# Patient Record
Sex: Male | Born: 2000 | Race: White | Hispanic: No | Marital: Single | Smoking: Never smoker
Health system: Southern US, Community
[De-identification: ages and names within clinical notes are randomized; demographics above are authoritative.]

---

## 2019-09-07 ENCOUNTER — Other Ambulatory Visit: Payer: Self-pay

## 2019-09-07 ENCOUNTER — Emergency Department: Payer: BC Managed Care – PPO

## 2019-09-07 ENCOUNTER — Emergency Department
Admission: EM | Admit: 2019-09-07 | Discharge: 2019-09-07 | Disposition: A | Discharge status: Home / Self Care | Payer: BC Managed Care – PPO | Attending: Emergency Medicine | Admitting: Emergency Medicine

## 2019-09-07 DIAGNOSIS — S61411A Laceration without foreign body of right hand, initial encounter: Secondary | ICD-10-CM | POA: Diagnosis not present

## 2019-09-07 DIAGNOSIS — Y280XXA Contact with sharp glass, undetermined intent, initial encounter: Secondary | ICD-10-CM | POA: Insufficient documentation

## 2019-09-07 DIAGNOSIS — M79641 Pain in right hand: Secondary | ICD-10-CM | POA: Insufficient documentation

## 2019-09-07 DIAGNOSIS — Y929 Unspecified place or not applicable: Secondary | ICD-10-CM | POA: Insufficient documentation

## 2019-09-07 DIAGNOSIS — Y939 Activity, unspecified: Secondary | ICD-10-CM | POA: Insufficient documentation

## 2019-09-07 DIAGNOSIS — Y999 Unspecified external cause status: Secondary | ICD-10-CM | POA: Insufficient documentation

## 2019-09-07 MED ORDER — SULFAMETHOXAZOLE-TRIMETHOPRIM 800-160 MG PO TABS: 1.0000 | ORAL_TABLET | Freq: Two times a day (BID) | ORAL | 0 refills | Status: AC

## 2019-09-07 NOTE — Discharge Instructions (Signed)
Take Tylenol and ibuprofen alternating for right hand pain. Take Bactrim twice daily for the next week.

## 2019-09-07 NOTE — ED Notes (Signed)
Pt has right hand injury and laceration noted to hand. Bleeding controlled at this time

## 2019-09-07 NOTE — ED Triage Notes (Signed)
Right hand injury and laceration after "punching through glass" yesterday. Pt alert and oriented X4, cooperative, RR even and unlabored, color WNL. Pt in NAD.

## 2019-09-07 NOTE — ED Provider Notes (Signed)
[facility] Emergency Department Provider Note  ____________________________________________  Time seen: Approximately 2:58 PM  I have reviewed the triage vital signs and the nursing notes.   HISTORY  Chief Complaint Laceration and Hand Injury    HPI Samuel Stokes is a 18 y.o. male presents to the emergency department with a 1 cm laceration to the dorsal aspect of the right fourth digit.  Patient reports that he was tapping on glass and glass busted at around 12 AM last night.  Patient has experience right hand discomfort since injury occurred.  He was seen and evaluated at a local urgent care who recommended that he come to the emergency department because his laceration was "too deep".  He denies numbness or tingling in the right hand or sensation of coldness.  Tetanus status is up-to-date.  No other alleviating measures have been attempted.        History reviewed. No pertinent past medical history.  There are no active problems to display for this patient.   History reviewed. No pertinent surgical history.  Prior to Admission medications   Medication Sig Start Date End Date Taking? Authorizing Provider  sulfamethoxazole-trimethoprim (BACTRIM DS) 800-160 MG tablet Take 1 tablet by mouth 2 (two) times daily for 7 days. 09/07/19 09/14/19  Orvil Feil, PA-C    Allergies Amoxicillin and Penicillins  No family history on file.  Social History Social History   Tobacco Use  . Smoking status: Never Smoker  Substance Use Topics  . Alcohol use: Yes    Comment: occass  . Drug use: Not on file     Review of Systems  Constitutional: No fever/chills Eyes: No visual changes. No discharge ENT: No upper respiratory complaints. Cardiovascular: no chest pain. Respiratory: no cough. No SOB. Gastrointestinal: No abdominal pain.  No nausea, no vomiting.  No diarrhea.  No constipation. Musculoskeletal: Patient has right hand pain  Skin: Patient has  right hand laceration.  Neurological: Negative for headaches, focal weakness or numbness.  ____________________________________________   PHYSICAL EXAM:  VITAL SIGNS: ED Triage Vitals [09/07/19 1402]  Enc Vitals Group     BP (!) 146/84     Pulse Rate 83     Resp 18     Temp 98.4 F (36.9 C)     Temp Source Oral     SpO2 98 %     Weight 165 lb (74.8 kg)     Height 5\' 10"  (1.778 m)     Head Circumference      Peak Flow      Pain Score 2     Pain Loc      Pain Edu?      Excl. in GC?      Constitutional: Alert and oriented. Well appearing and in no acute distress. Eyes: Conjunctivae are normal. PERRL. EOMI. Head: Atraumatic. Cardiovascular: Normal rate, regular rhythm. Normal S1 and S2.  Good peripheral circulation. Respiratory: Normal respiratory effort without tachypnea or retractions. Lungs CTAB. Good air entry to the bases with no decreased or absent breath sounds. Gastrointestinal: Bowel sounds 4 quadrants. Soft and nontender to palpation. No guarding or rigidity. No palpable masses. No distention. No CVA tenderness. Musculoskeletal: Patient performs full range of motion at the right wrist and right hand.  No flexor or extensor tendon deficits are appreciated with testing.  Palpable radial and ulnar pulses bilaterally and symmetrically. Neurologic:  Normal speech and language. No gross focal neurologic deficits are appreciated.  Skin: Patient has a 1 cm semilunar  laceration along the dorsal aspect of the right fourth digit. Psychiatric: Mood and affect are normal. Speech and behavior are normal. Patient exhibits appropriate insight and judgement.   ____________________________________________   LABS (all labs ordered are listed, but only abnormal results are displayed)  Labs Reviewed - No data to display ____________________________________________  EKG   ____________________________________________  RADIOLOGY   Dg Hand Complete Right  Result Date:  09/07/2019 CLINICAL DATA:  Laceration to second, fourth and fifth digits EXAM: RIGHT HAND - COMPLETE 3+ VIEW COMPARISON:  None. FINDINGS: Radiographically evident laceration seen along the dorsal aspect of the fourth digit at the level of the head of the proximal phalanx. Reported lacerations of the second and fifth digit are not visualized on radiography. Additional geometric lucency projects at the level of the interspace between the second and third metacarpals likely additional laceration. No associated osseous injuries are seen. No traumatic malalignment. IMPRESSION: Radiographically evident laceration along the volar aspect of the hand between the second and third metacarpals and along the volar aspect of the fourth digit near the head of the proximal phalanx. No associated osseous injury. No other radiographically evident laceration. Electronically Signed   By: Lovena Le M.D.   On: 09/07/2019 15:26    ____________________________________________    PROCEDURES  Procedure(s) performed:    Procedures  LACERATION REPAIR Performed by: Lannie Fields Authorized by: Lannie Fields Consent: Verbal consent obtained. Risks and benefits: risks, benefits and alternatives were discussed Consent given by: patient Patient identity confirmed: provided demographic data Prepped and Draped in normal sterile fashion Wound explored  Laceration Location: Right fourth digit  Laceration Length: 1 cm  No Foreign Bodies seen or palpated  Irrigation method: syringe Amount of cleaning: standard  Skin closure: Steri-strips   Patient tolerance: Patient tolerated the procedure well with no immediate complications.   Medications - No data to display   ____________________________________________   INITIAL IMPRESSION / ASSESSMENT AND PLAN / ED COURSE  Pertinent labs & imaging results that were available during my care of the patient were reviewed by me and considered in my medical decision  making (see chart for details).  Review of the [place] CSRS was performed in accordance of the [place] prior to dispensing any controlled drugs.           Assessment and Plan: Right hand pain:  Patient presents to the emergency department with acute right hand pain with a semilunar laceration along the dorsal aspect of the right fourth digit.  X-ray examination of the right hand reveals no bony abnormality.  Laceration was repaired in the emergency department with Steri-Strips due to the amount of time laceration has been open.  Patient was discharged with Bactrim.  Patient was advised to follow-up with primary care as needed.  All patient questions were answered.    ____________________________________________  FINAL CLINICAL IMPRESSION(S) / ED DIAGNOSES  Final diagnoses:  Pain of right hand      NEW MEDICATIONS STARTED DURING THIS VISIT:  ED Discharge Orders         Ordered    sulfamethoxazole-trimethoprim (BACTRIM DS) 800-160 MG tablet  2 times daily     09/07/19 1534              This chart was dictated using voice recognition software/Dragon. Despite best efforts to proofread, errors can occur which can change the meaning. Any change was purely unintentional. Microphone test   Lannie Fields, PA-C 09/07/19 1538    Blake Divine, MD 09/08/19 0001

## 2019-10-26 ENCOUNTER — Ambulatory Visit: Payer: Self-pay | Admitting: *Deleted

## 2019-10-26 NOTE — Telephone Encounter (Signed)
Third attempt to contact patient- same message as previous. Call closed.

## 2019-10-26 NOTE — Telephone Encounter (Signed)
Patient would like a call back regarding having a positive covid test. He is wanting to go out of town and needed to know when should he test again. Would like a call back please   Attempted to call patient- mailbox is full and can not receive messages at this time - unable to leave message to call back

## 2019-10-26 NOTE — Telephone Encounter (Signed)
Attempted to call patient back- same message as previous.

## 2020-09-01 ENCOUNTER — Other Ambulatory Visit: Payer: BC Managed Care – PPO

## 2020-09-01 DIAGNOSIS — Z20822 Contact with and (suspected) exposure to covid-19: Secondary | ICD-10-CM

## 2020-09-02 LAB — SARS-COV-2, NAA 2 DAY TAT

## 2020-09-02 LAB — NOVEL CORONAVIRUS, NAA: SARS-CoV-2, NAA: NOT DETECTED

## 2021-02-05 IMAGING — DX DG HAND COMPLETE 3+V*R*
3 series · 3 of 3 positions shown · non-contrast
Comparison: None.

CLINICAL DATA: Laceration to second, fourth and fifth digits

EXAM:
RIGHT HAND - COMPLETE 3+ VIEW

[hand ap]
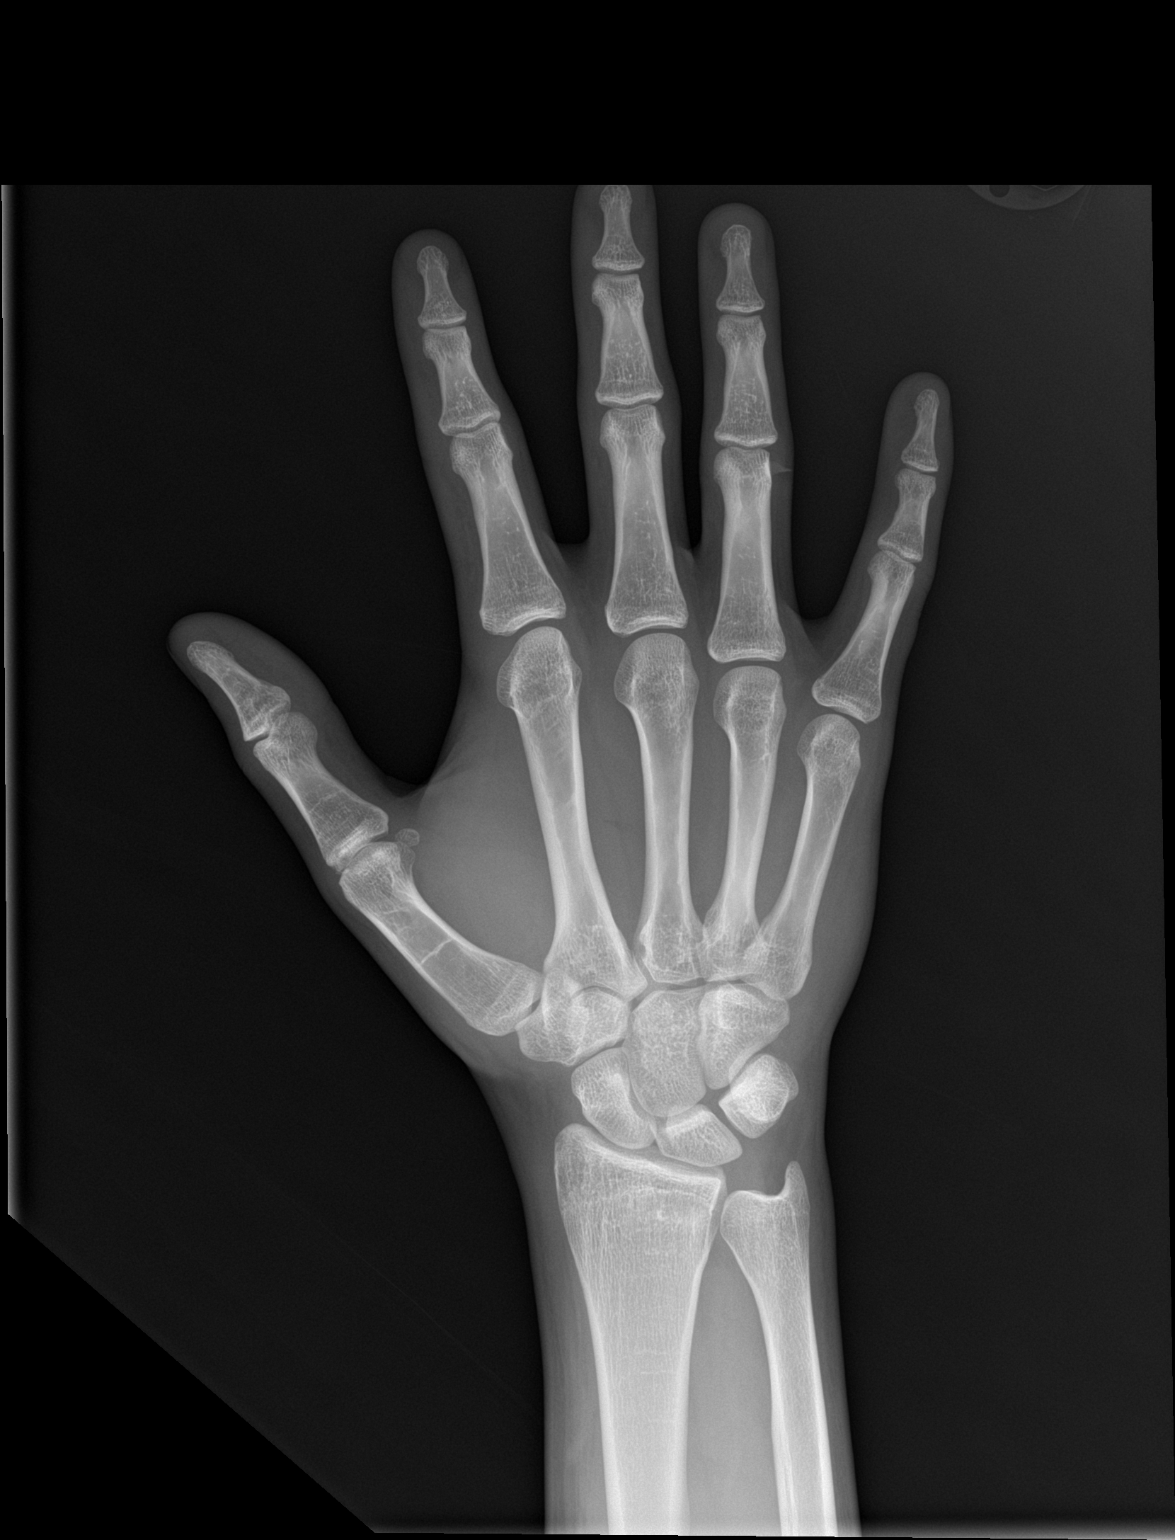

[hand obl]
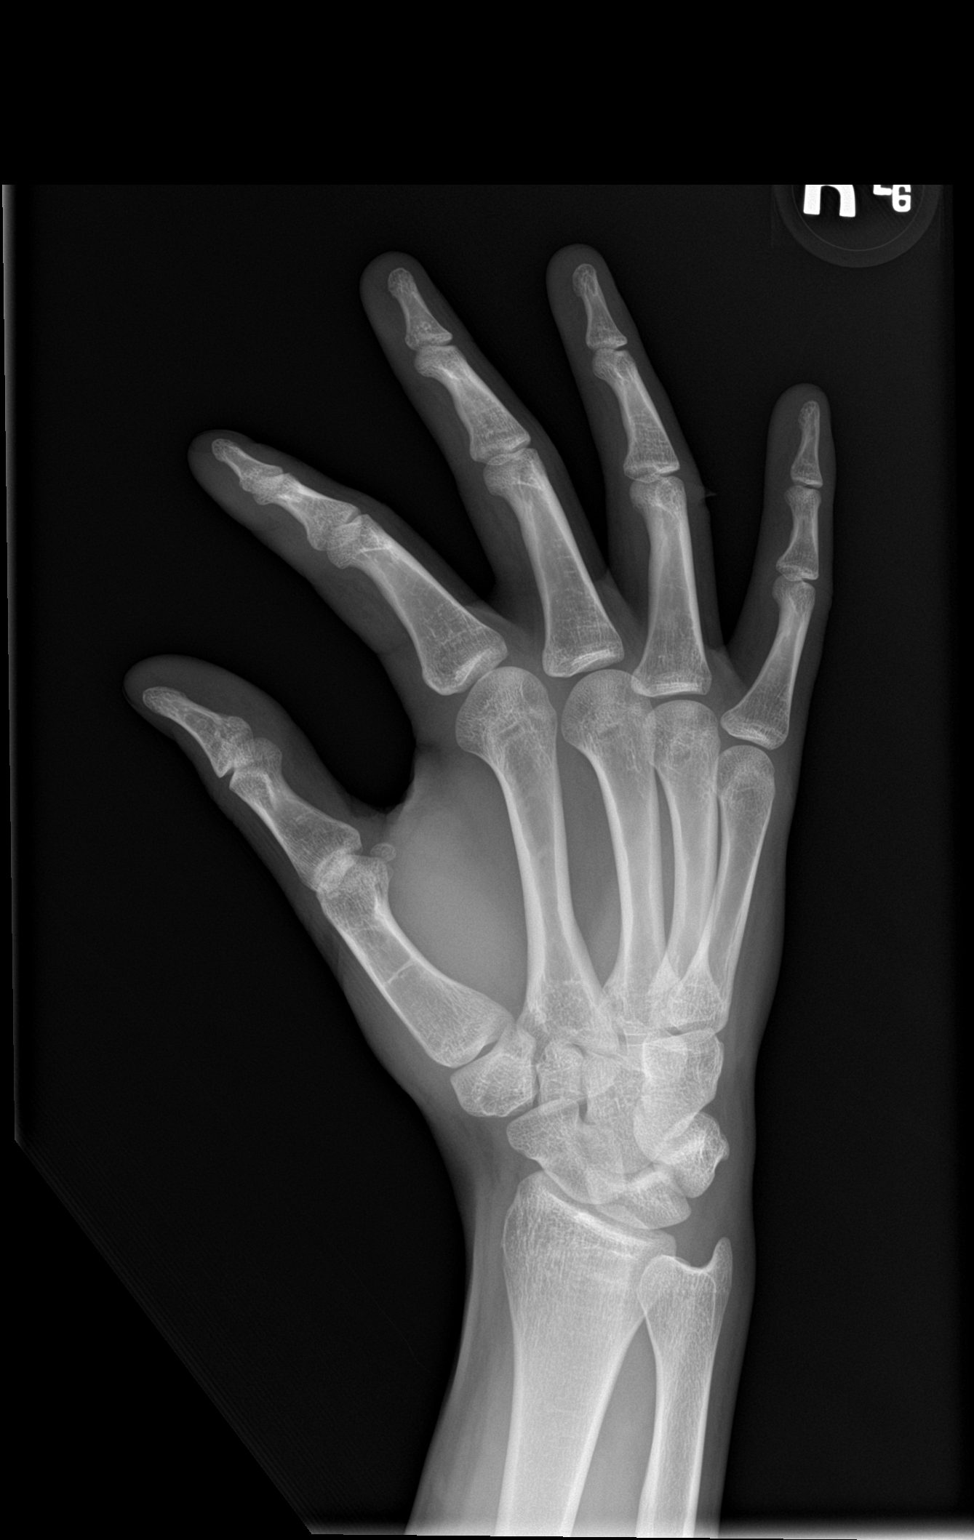

[hand lat]
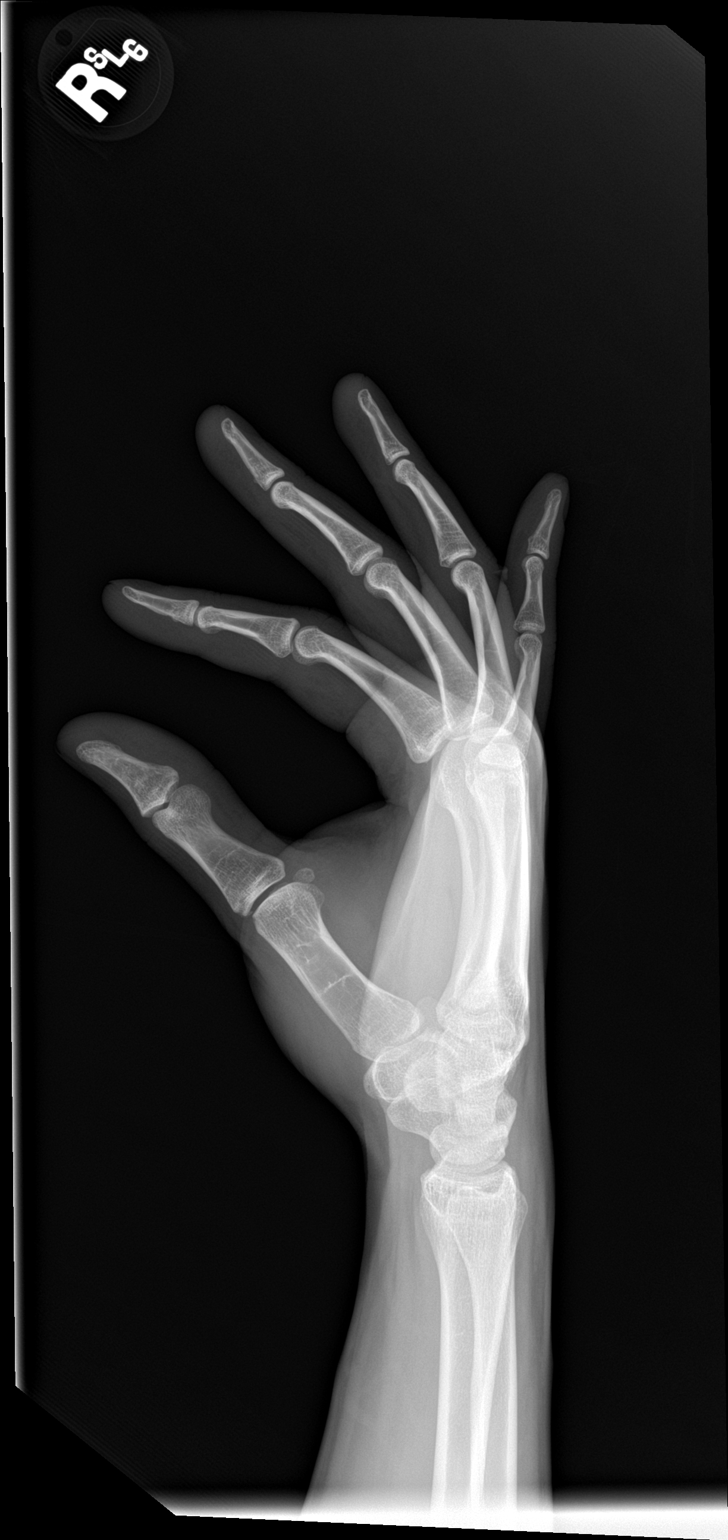

[3 of 3 positions shown; findings below may reference images not displayed]

FINDINGS: Radiographically evident laceration seen along the dorsal aspect of
the fourth digit at the level of the head of the proximal phalanx.
Reported lacerations of the second and fifth digit are not
visualized on radiography. Additional geometric lucency projects at
the level of the interspace between the second and third metacarpals
likely additional laceration. No associated osseous injuries are
seen. No traumatic malalignment.
IMPRESSION: Radiographically evident laceration along the volar aspect of the
hand between the second and third metacarpals and along the volar
aspect of the fourth digit near the head of the proximal phalanx.

No associated osseous injury. No other radiographically evident
laceration.

## 2022-03-15 ENCOUNTER — Emergency Department: Payer: BC Managed Care – PPO

## 2022-03-15 ENCOUNTER — Emergency Department
Admission: EM | Admit: 2022-03-15 | Discharge: 2022-03-15 | Disposition: A | Discharge status: Home / Self Care | Payer: BC Managed Care – PPO | Attending: Emergency Medicine | Admitting: Emergency Medicine

## 2022-03-15 DIAGNOSIS — W270XXA Contact with workbench tool, initial encounter: Secondary | ICD-10-CM | POA: Diagnosis not present

## 2022-03-15 DIAGNOSIS — S91311A Laceration without foreign body, right foot, initial encounter: Secondary | ICD-10-CM | POA: Diagnosis not present

## 2022-03-15 DIAGNOSIS — S99921A Unspecified injury of right foot, initial encounter: Secondary | ICD-10-CM | POA: Diagnosis present

## 2022-03-15 MED ORDER — LIDOCAINE-EPINEPHRINE (PF) 2 %-1:200000 IJ SOLN
10.0000 mL | Freq: Once | INTRAMUSCULAR | Status: AC
  Administered 2022-03-15: 10 mL
  Filled 2022-03-15: qty 20

## 2022-03-15 NOTE — ED Provider Notes (Signed)
? ?[facility] ?Provider Note ? ? ? Event Date/Time  ? First MD Initiated Contact with Patient 03/15/22 2237   ?  (approximate) ? ? ?History  ? ?Chief Complaint ?Laceration ? ? ?HPI ?Samuel Stokes is a 21 y.o. male, no remarkable medical history, presents to the emergency department for evaluation of laceration.  Patient states that he was swinging an ax a tree when the ax accidentally slipped and cut his right foot.  Patient was wearing a shoe at the time.  Denies any other injuries at this time.  No recent illnesses.  He is up-to-date on his tetanus. ? ?History Limitations: No limitations. ? ?    ? ? ?Physical Exam  ?Triage Vital Signs: ?ED Triage Vitals [03/15/22 2146]  ?Enc Vitals Group  ?   BP 119/73  ?   Pulse Rate 72  ?   Resp 14  ?   Temp 98.1 ?F (36.7 ?C)  ?   Temp Source Oral  ?   SpO2 100 %  ?   Weight   ?   Height   ?   Head Circumference   ?   Peak Flow   ?   Pain Score   ?   Pain Loc   ?   Pain Edu?   ?   Excl. in GC?   ? ? ?Most recent vital signs: ?Vitals:  ? 03/15/22 2146  ?BP: 119/73  ?Pulse: 72  ?Resp: 14  ?Temp: 98.1 ?F (36.7 ?C)  ?SpO2: 100%  ? ? ?General: Awake, NAD.  ?Skin: Warm, dry. No rashes or lesions.  ?Eyes: PERRL. Conjunctivae normal.  ?ENT: Throat clear, no erythema or exudates. Uvula midline.  ?Neck: Normal ROM. No nuchal rigidity.  ?CV: Good peripheral perfusion.  ?Resp: Normal effort.  ?Abd: Soft, non-tender. No distention.  ?Neuro: At baseline. No gross neurological deficits.  ?MSK: No gross deformities. Normal ROM in all extremities.  ? ?Other: 2 cm linear laceration along the dorsum of the foot, overlying the fifth metatarsal.  No active bleeding or discharge present.  No surrounding warmth, erythema, or tenderness.  No notable foreign bodies.  Patient maintains full range of motion of his foot, including flexion and extension.  Pulse, motor, sensation intact distally. ? ?Physical Exam ? ? ? ?ED Results / Procedures / Treatments  ?Labs ?(all labs ordered  are listed, but only abnormal results are displayed) ?Labs Reviewed - No data to display ? ? ?EKG ?Not applicable. ? ? ?RADIOLOGY ? ?ED Provider Interpretation: I personally reviewed this x-ray, no evidence of fracture or foreign body based on my interpretation. ? ?DG Foot Complete Right ? ?Result Date: 03/15/2022 ?CLINICAL DATA:  Injury from axe.  Laceration. EXAM: RIGHT FOOT COMPLETE - 3+ VIEW COMPARISON:  None. FINDINGS: There is no evidence of fracture or dislocation. There is no evidence of arthropathy or other focal bone abnormality. The site of laceration is not well-defined by radiograph. No radiopaque foreign body. No tracking soft tissue air. IMPRESSION: 1. No fracture or dislocation. 2. No radiopaque foreign body or tracking soft tissue air. Electronically Signed   By: Narda Rutherford M.D.   On: 03/15/2022 23:30   ? ?PROCEDURES: ? ?Critical Care performed: None. ? ?Marland Kitchen.Laceration Repair ? ?Date/Time: 03/16/2022 12:57 PM ?Performed by: Varney Daily, PA ?Authorized by: Varney Daily, PA  ? ?Consent:  ?  Consent obtained:  Verbal ?  Consent given by:  Patient ?  Risks discussed:  Pain, retained foreign body and infection ?Universal  protocol:  ?  Patient identity confirmed:  Verbally with patient ?Laceration details:  ?  Location:  Foot ?  Foot location:  Top of R foot ?  Length (cm):  2 ?  Depth (mm):  2 ?Pre-procedure details:  ?  Preparation:  Patient was prepped and draped in usual sterile fashion ?Exploration:  ?  Hemostasis achieved with:  Direct pressure ?  Imaging outcome: foreign body not noted   ?  Wound exploration: wound explored through full range of motion and entire depth of wound visualized   ?  Wound extent: no fascia violation noted, no foreign bodies/material noted, no muscle damage noted and no underlying fracture noted   ?Treatment:  ?  Area cleansed with:  Saline ?  Amount of cleaning:  Standard ?  Irrigation solution:  Sterile saline ?  Irrigation volume:  1 liter ?   Irrigation method:  Pressure wash ?Skin repair:  ?  Repair method:  Sutures ?  Suture size:  4-0 ?  Suture material:  Nylon ?  Suture technique:  Simple interrupted ?  Number of sutures:  2 ?Approximation:  ?  Approximation:  Close ?Repair type:  ?  Repair type:  Simple ?Post-procedure details:  ?  Dressing:  Adhesive bandage ?  Procedure completion:  Tolerated well, no immediate complications ? ? ? ?MEDICATIONS ORDERED IN ED: ?Medications  ?lidocaine-EPINEPHrine (XYLOCAINE W/EPI) 2 %-1:200000 (PF) injection 10 mL (10 mLs Infiltration Given by Other 03/15/22 2302)  ? ? ? ?IMPRESSION / MDM / ASSESSMENT AND PLAN / ED COURSE  ?I reviewed the triage vital signs and the nursing notes. ?             ?               ? ?Differential diagnosis includes, but is not limited to, metatarsal fracture, laceration, foreign body, cellulitis. ? ?ED Course ?Patient appears well.  Vitals are within normal limits.  NAD.  Does not want any pain medication at this time. ? ?Anesthetize area utilizing 2% lidocaine with epinephrine.  Cleansed thoroughly with normal saline.  Repaired utilizing 4-0 simple interrupted sutures x 2.  See above for details. ? ?Assessment/Plan ?Patient presents with 2 cm linear laceration along the dorsum of the right foot following ax injury.  X-ray reassuring for no evidence of foreign body or underlying metatarsal fracture.  Physical exam does not suggest any significant soft tissue injury.  Repaired the patient's laceration with simple interrupted sutures.  Patient tolerated the procedure well with no immediate complications.  We will plan to discharge. ? ?Patient was provided with anticipatory guidance, return precautions, and educational material. Encouraged the patient to return to the emergency department at any time if they begin to experience any new or worsening symptoms. Patient expressed understanding and agreed with the plan. ? ?  ? ? ?FINAL CLINICAL IMPRESSION(S) / ED DIAGNOSES  ? ?Final diagnoses:   ?Laceration of right foot, initial encounter  ? ? ? ?Rx / DC Orders  ? ?ED Discharge Orders   ? ? None  ? ?  ? ? ? ?Note:  This document was prepared using Dragon voice recognition software and may include unintentional dictation errors. ?  ?Varney Daily, [place] ?03/16/22 1259 ? ?  ?Georga Hacking, MD ?03/19/22 [phone] ? ?

## 2022-03-15 NOTE — ED Triage Notes (Signed)
Pt presents via POV c/p laceration to left foot by axe. Apx 1-2in in diameter. Bleeding controled.  ?

## 2022-03-15 NOTE — Discharge Instructions (Addendum)
-  Return to the emergency department, urgent care, or see your regular provider to have sutures removed within 7 to 10 days. ?-Return in the emergency department if you begin to experience any new or worsening symptoms. ?-You may take Tylenol/ibuprofen as needed for pain. ?

## 2023-08-14 IMAGING — DX DG FOOT COMPLETE 3+V*R*
3 series · 3 of 3 positions shown · non-contrast
Comparison: None.

CLINICAL DATA: Injury from axe.  Laceration.

EXAM:
RIGHT FOOT COMPLETE - 3+ VIEW

[foot ap]
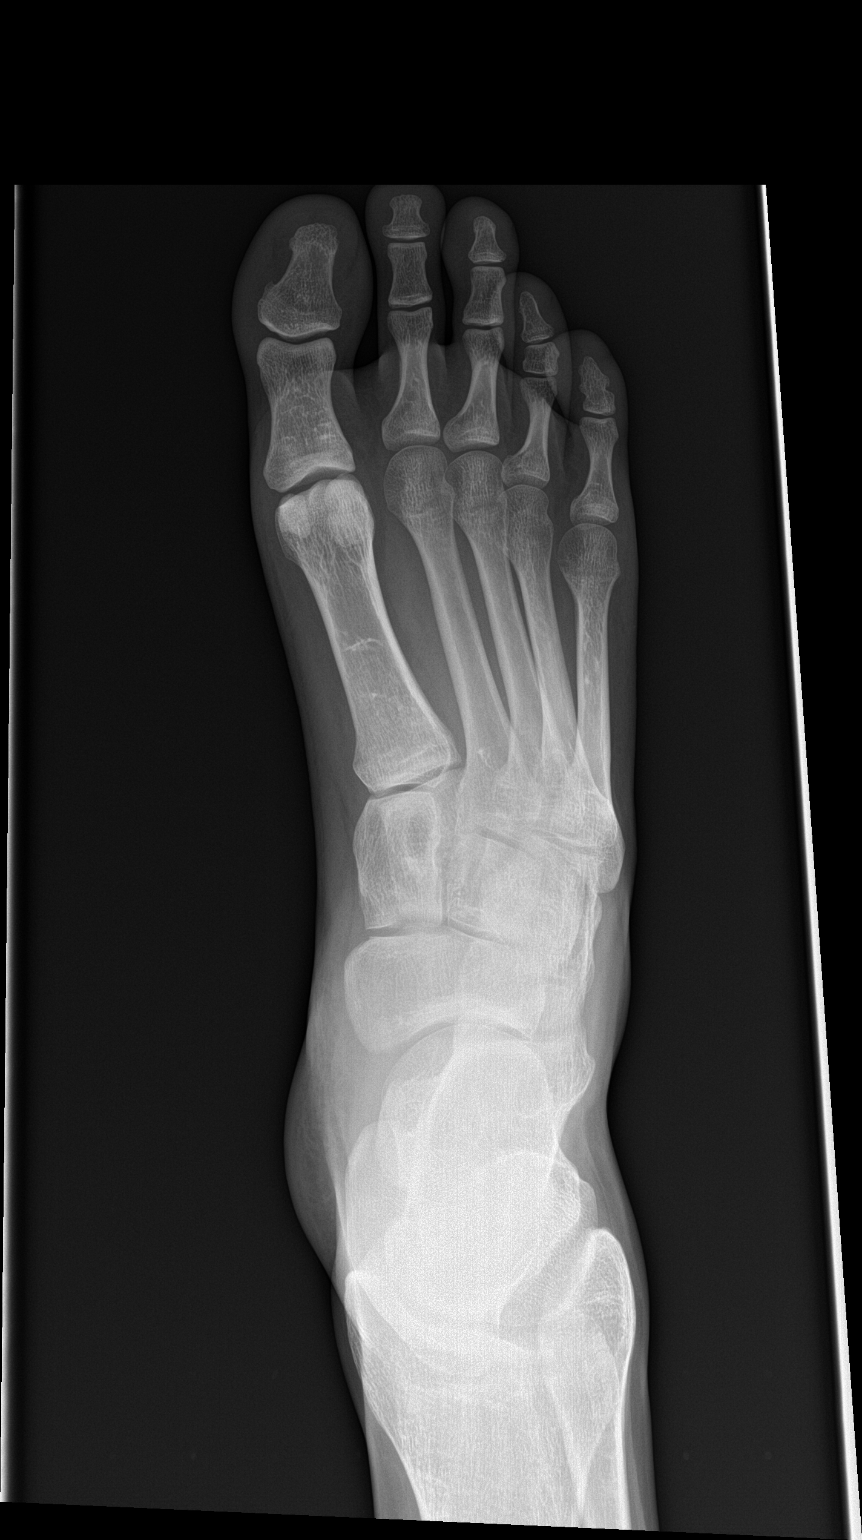

[foot obl]
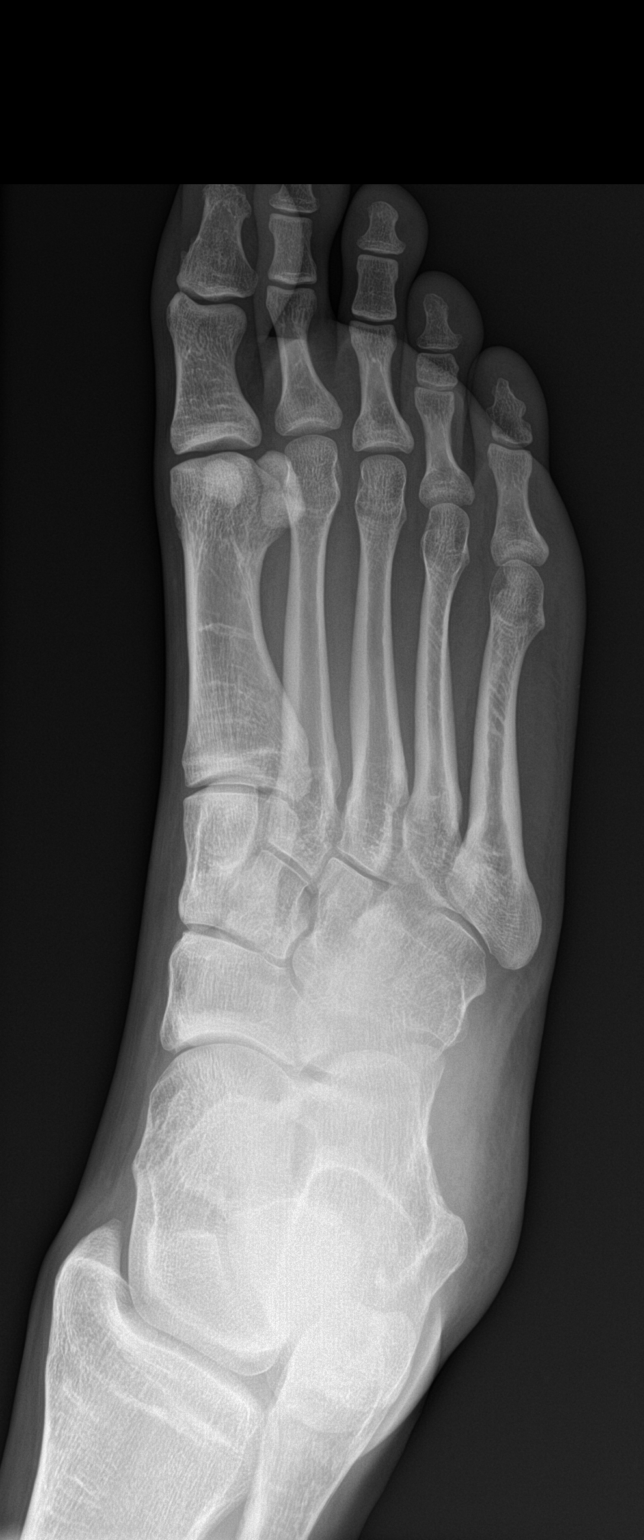

[foot lat]
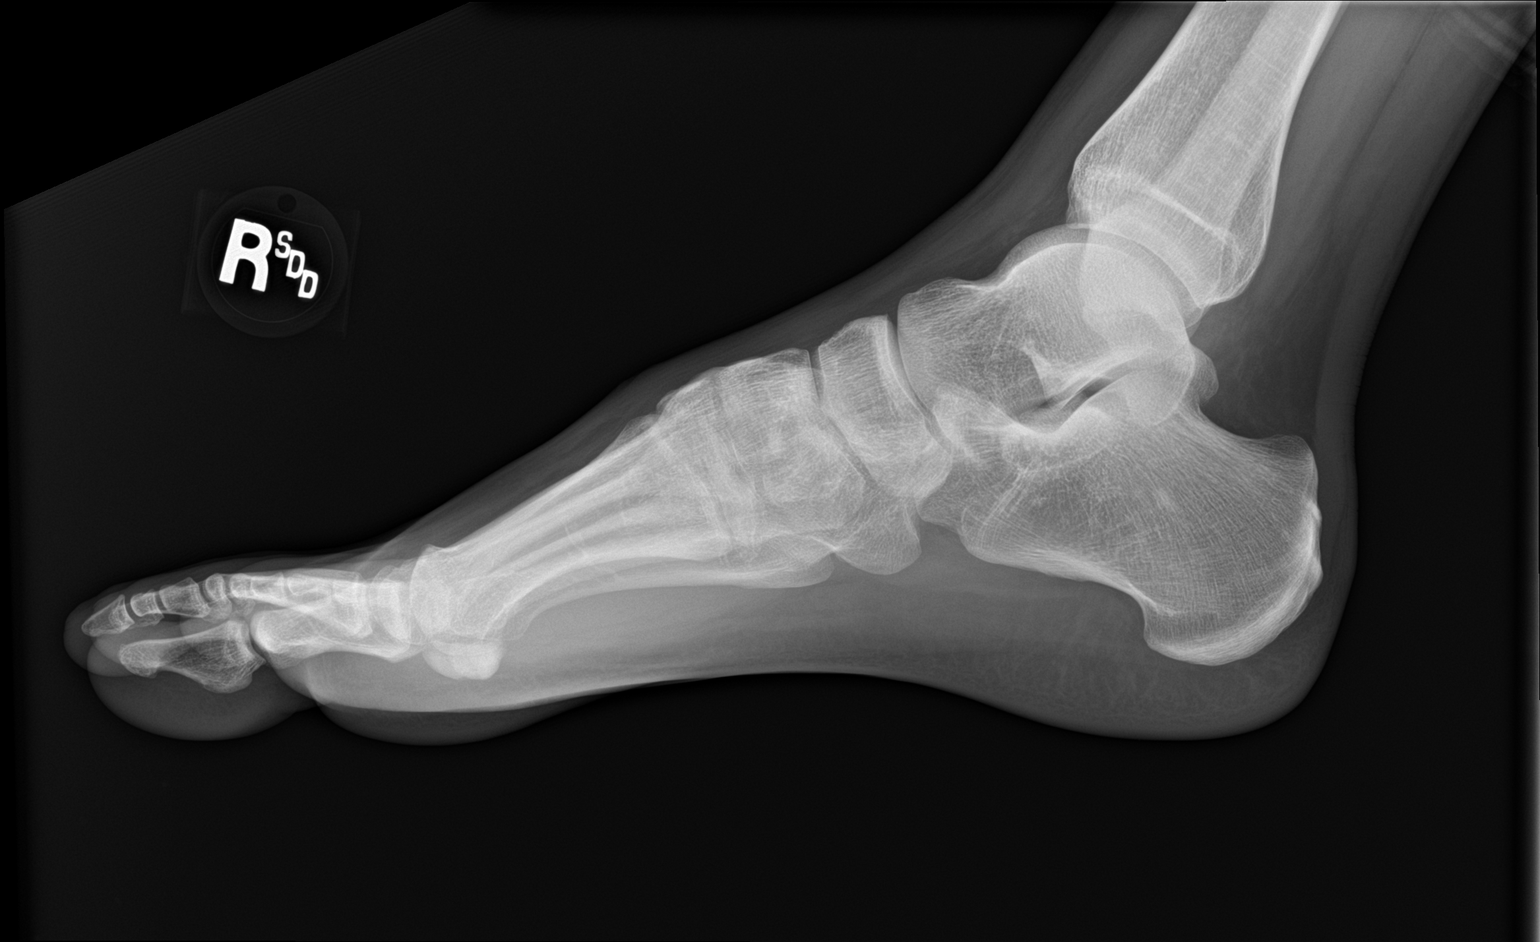

[3 of 3 positions shown; findings below may reference images not displayed]

FINDINGS: There is no evidence of fracture or dislocation. There is no
evidence of arthropathy or other focal bone abnormality. The site of
laceration is not well-defined by radiograph. No radiopaque foreign
body. No tracking soft tissue air.
IMPRESSION: 1. No fracture or dislocation.
2. No radiopaque foreign body or tracking soft tissue air.
